# Patient Record
Sex: Female | Born: 1985 | Race: Black or African American | Hispanic: No | Marital: Single | Smoking: Never smoker
Health system: Southern US, Community
[De-identification: ages and names within clinical notes are randomized; demographics above are authoritative.]

---

## 2012-02-22 ENCOUNTER — Other Ambulatory Visit: Payer: Self-pay | Admitting: Obstetrics and Gynecology

## 2012-02-22 ENCOUNTER — Ambulatory Visit
Admission: RE | Admit: 2012-02-22 | Discharge: 2012-02-22 | Disposition: A | Discharge status: Home / Self Care | Payer: 59 | Source: Ambulatory Visit | Attending: Obstetrics and Gynecology | Admitting: Obstetrics and Gynecology

## 2012-02-22 DIAGNOSIS — R109 Unspecified abdominal pain: Secondary | ICD-10-CM

## 2012-12-10 IMAGING — CT CT ABD-PELV W/O CM
2 of 4 series · 17 of 46 positions shown, 19 images · non-contrast
Comparison: None.

CLINICAL DATA: Right flank pain, nausea.  Evaluate for renal
stones.

CT ABDOMEN AND PELVIS WITHOUT CONTRAST
TECHNIQUE: Multidetector CT imaging of the abdomen and pelvis was
performed following the standard protocol without intravenous
contrast.

[Series 2: renal stone w/o · axial · non-contrast · 0.59mm/px · z∈[-393,-38]mm · 14 of 79 slices shown, 16 images]
[im 4/79  soft-tissue]
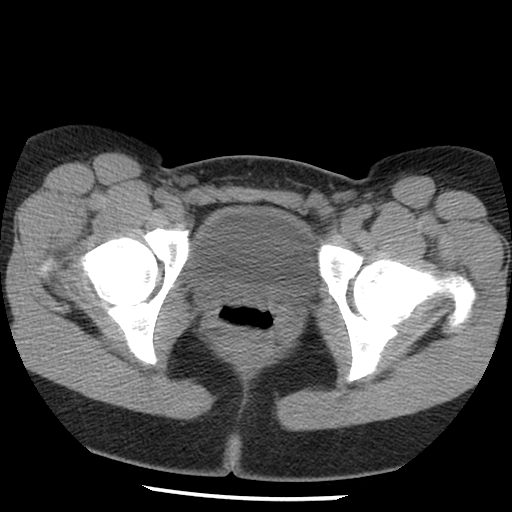
[im 4/79  bone]
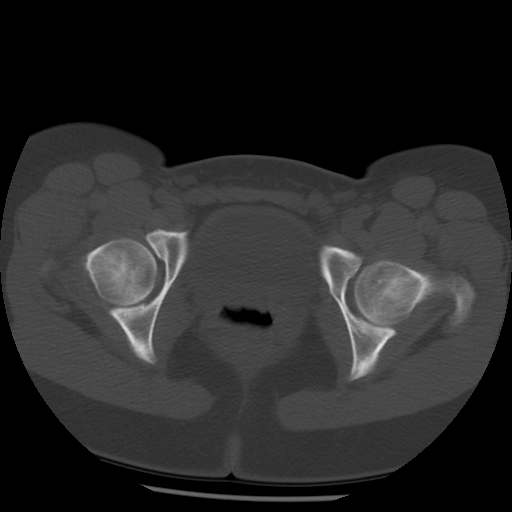
[im 10/79  soft-tissue]
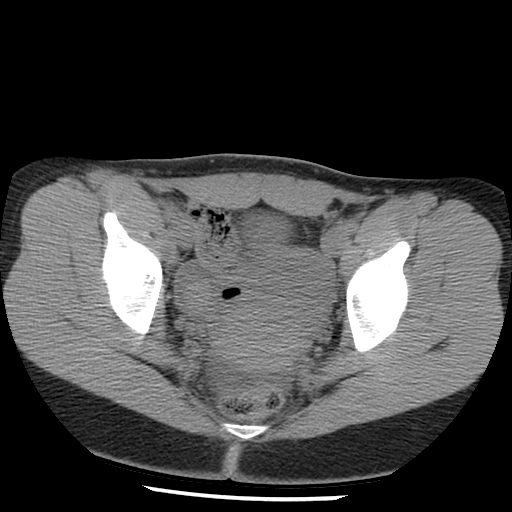
[im 17/79  soft-tissue]
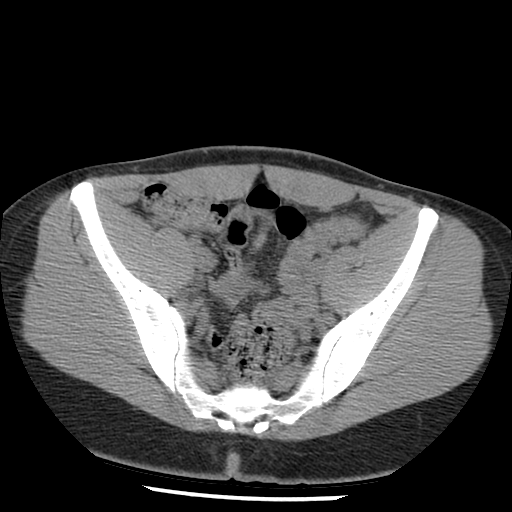
[im 20/79  soft-tissue]
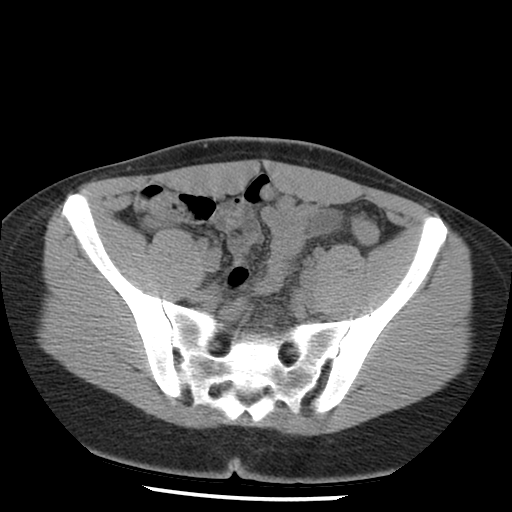
[im 27/79  soft-tissue]
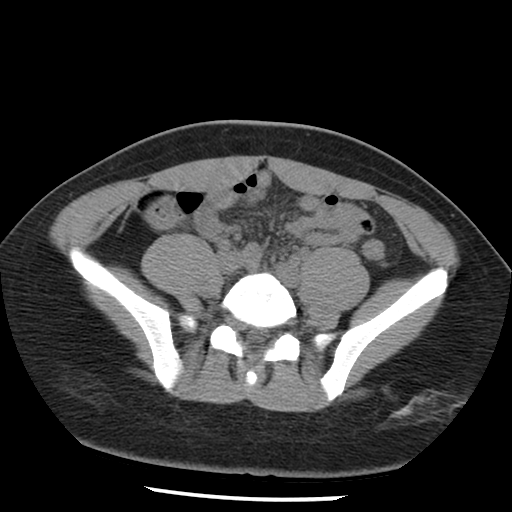
[im 33/79  soft-tissue]
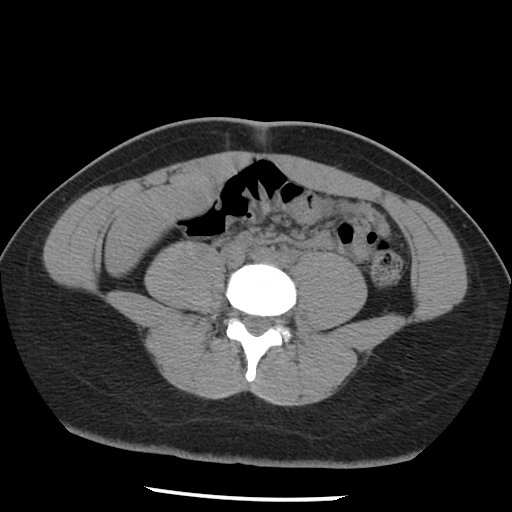
[im 36/79  soft-tissue]
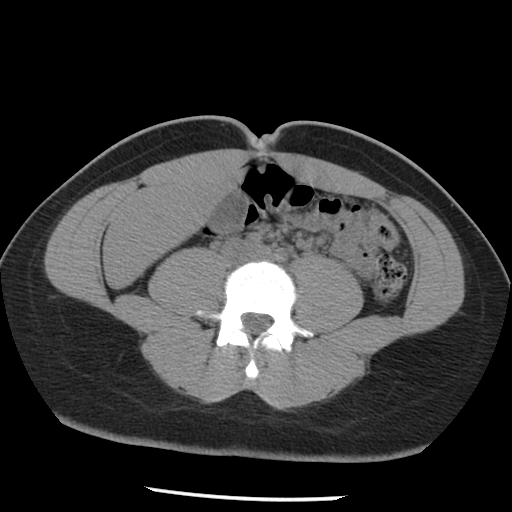
[im 43/79  soft-tissue]
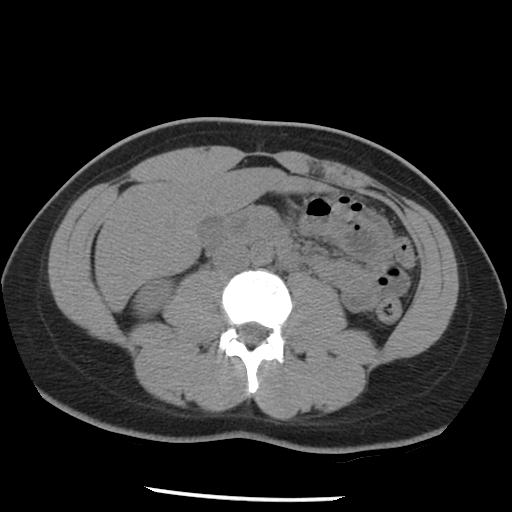
[im 46/79  soft-tissue]
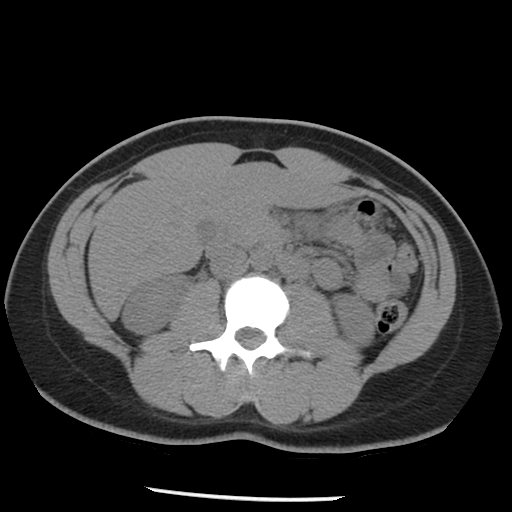
[im 46/79  bone]
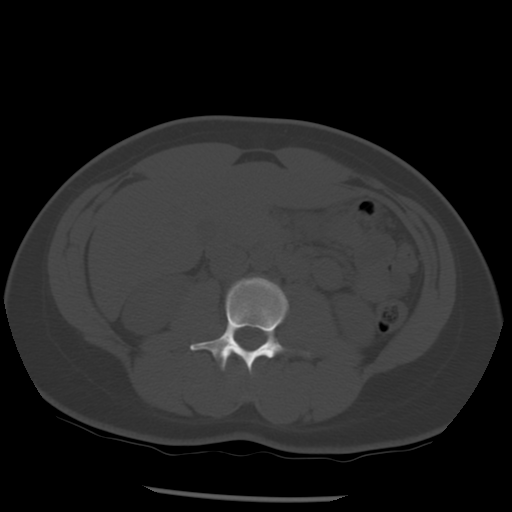
[im 53/79  soft-tissue]
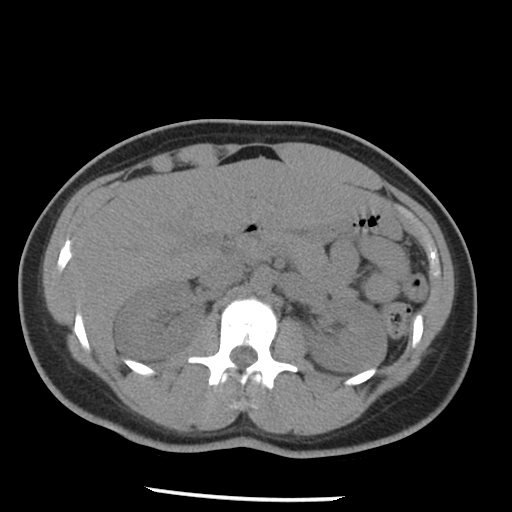
[im 59/79  soft-tissue]
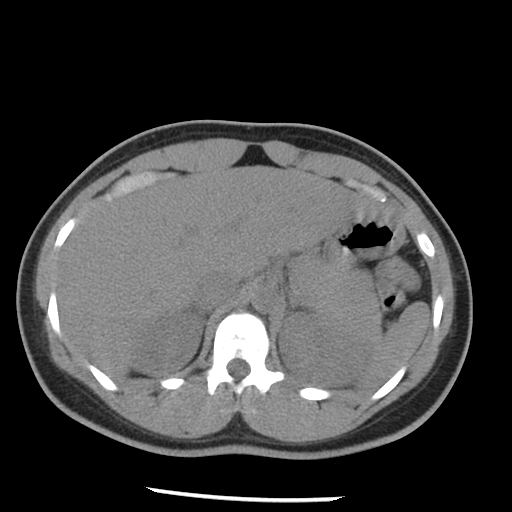
[im 62/79  soft-tissue]
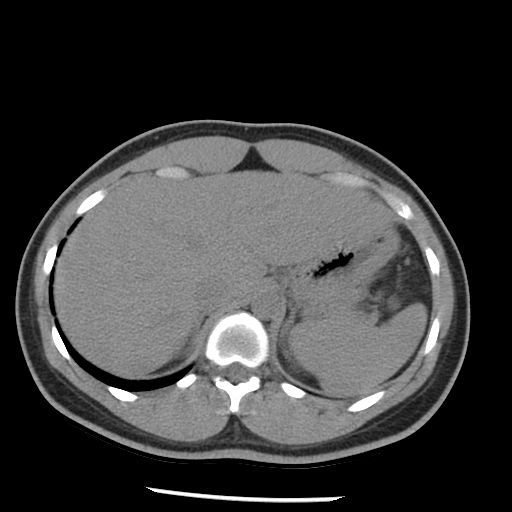
[im 69/79  soft-tissue]
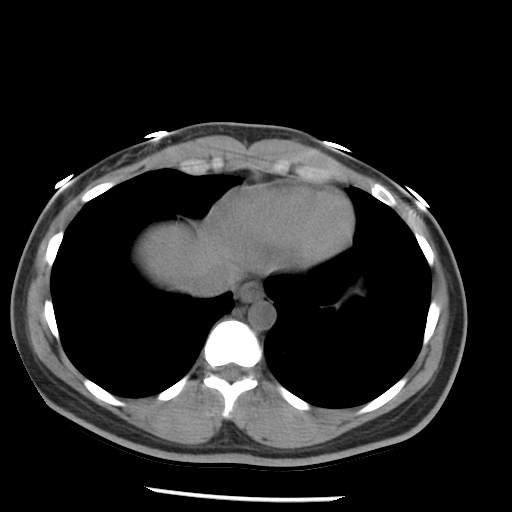
[im 75/79  soft-tissue]
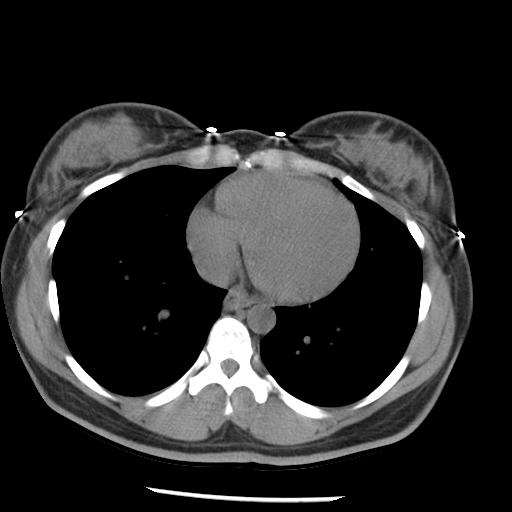

[Series 400: coronal · coronal · 0.94mm/px · 3 of 92 slices shown]
[im 31/92  soft-tissue]
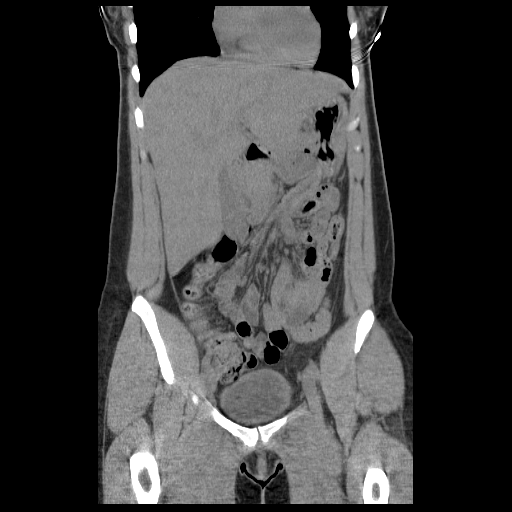
[im 41/92  soft-tissue]
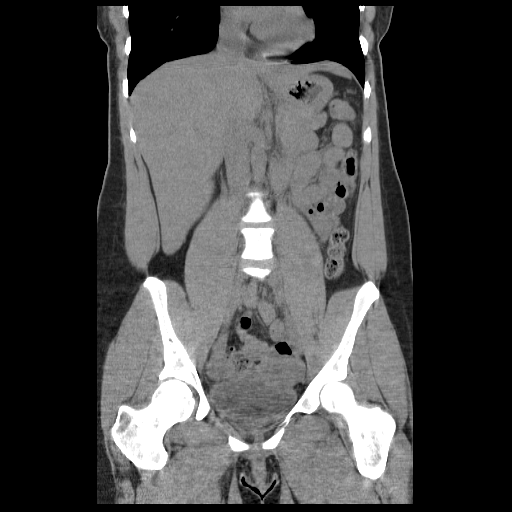
[im 51/92  soft-tissue]
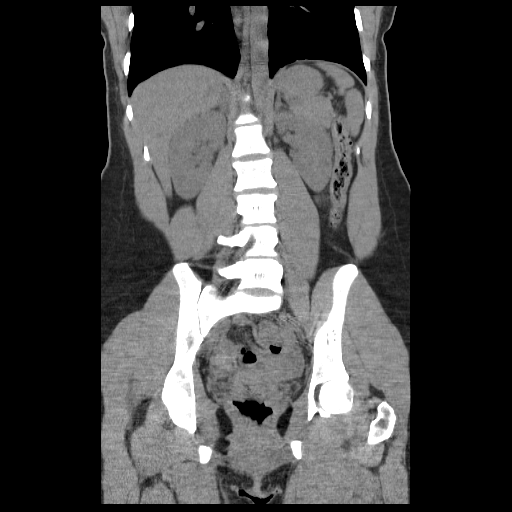

[17 of 46 positions shown; findings below may reference images not displayed]

FINDINGS: Lung bases are clear.  No pericardial fluid.

No focal hepatic lesion.  The gallbladder, pancreas, spleen,
adrenal glands are normal.

No nephrolithiasis or ureterolithiasis.  No obstructive uropathy.

Abdominal aorta normal caliber.  No retroperitoneal periportal
adenopathy.

No free fluid the pelvis.  The bladder is normal.

There is a fullness within the lower uterine segment measuring
cm.  This could represent a lower uterine segment leiomyoma.  The
ovaries appear normal.  There is a coarse round calcification
measure 15 mm adjacent to the right ovary.  This may represent
exophytic leiomyoma which is calcified or potentially a calcified
ovarian lesion.
IMPRESSION: 1..  No nephrolithiasis, ureterolithiasis, obstructive uropathy.
2.  Potential lower uterine segment leiomyoma and right adnexal
indeterminate calcification.  Recommend non emergent outpatient
pelvic ultrasound for further evaluation.

## 2014-10-12 ENCOUNTER — Other Ambulatory Visit: Payer: Self-pay | Admitting: Obstetrics and Gynecology

## 2014-10-13 ENCOUNTER — Encounter (HOSPITAL_COMMUNITY): Payer: Self-pay

## 2014-10-13 ENCOUNTER — Encounter (HOSPITAL_COMMUNITY)
Admission: RE | Admit: 2014-10-13 | Discharge: 2014-10-13 | Disposition: A | Discharge status: Home / Self Care | Payer: PRIVATE HEALTH INSURANCE | Source: Ambulatory Visit | Attending: Obstetrics and Gynecology | Admitting: Obstetrics and Gynecology

## 2014-10-13 DIAGNOSIS — Z01818 Encounter for other preprocedural examination: Secondary | ICD-10-CM | POA: Insufficient documentation

## 2014-10-13 DIAGNOSIS — N839 Noninflammatory disorder of ovary, fallopian tube and broad ligament, unspecified: Secondary | ICD-10-CM | POA: Diagnosis not present

## 2014-10-13 LAB — CBC
HEMATOCRIT: 38.3 % (ref 36.0–46.0)
Hemoglobin: 13.1 g/dL (ref 12.0–15.0)
MCH: 32.3 pg (ref 26.0–34.0)
MCHC: 34.2 g/dL (ref 30.0–36.0)
MCV: 94.6 fL (ref 78.0–100.0)
Platelets: 301 10*3/uL (ref 150–400)
RBC: 4.05 MIL/uL (ref 3.87–5.11)
RDW: 12.9 % (ref 11.5–15.5)
WBC: 7.5 10*3/uL (ref 4.0–10.5)

## 2014-10-13 NOTE — Patient Instructions (Addendum)
   Your procedure is scheduled on: [place] through the Main Entrance of [facility] at: [place] up the phone at the desk and dial [phone] and inform us of your arrival.  Please call this number if you have any problems the morning of surgery: [phone]  Remember: Do not eat food after midnight: JAN 38 Do not drink clear liquids after: JAN 19 Take these medicines the morning of surgery with a SIP OF WATER: NONE  Do not wear jewelry, make-up,  No metal in your hair or on your body. Do not wear lotions, powders, perfumes.  You may wear deodorant.  Do not bring valuables to the hospital. Contacts, dentures or bridgework may not be worn into surgery.  Leave suitcase in the car. After Surgery it may be brought to your room. For patients being admitted to the hospital, checkout time is 11:00am the day of discharge.    Patients discharged on the day of surgery will not be allowed to drive home.

## 2014-10-21 ENCOUNTER — Ambulatory Visit (HOSPITAL_COMMUNITY): Payer: PRIVATE HEALTH INSURANCE | Admitting: Certified Registered Nurse Anesthetist

## 2014-10-21 ENCOUNTER — Encounter (HOSPITAL_COMMUNITY)
Admission: RE | Disposition: A | Discharge status: Home / Self Care | Payer: Self-pay | Source: Ambulatory Visit | Attending: Obstetrics and Gynecology

## 2014-10-21 ENCOUNTER — Encounter (HOSPITAL_COMMUNITY): Payer: Self-pay | Admitting: Certified Registered Nurse Anesthetist

## 2014-10-21 ENCOUNTER — Ambulatory Visit (HOSPITAL_COMMUNITY)
Admission: RE | Admit: 2014-10-21 | Discharge: 2014-10-21 | Disposition: A | Discharge status: Home / Self Care | Payer: PRIVATE HEALTH INSURANCE | Source: Ambulatory Visit | Attending: Obstetrics and Gynecology | Admitting: Obstetrics and Gynecology

## 2014-10-21 DIAGNOSIS — N838 Other noninflammatory disorders of ovary, fallopian tube and broad ligament: Secondary | ICD-10-CM

## 2014-10-21 DIAGNOSIS — D270 Benign neoplasm of right ovary: Secondary | ICD-10-CM | POA: Insufficient documentation

## 2014-10-21 DIAGNOSIS — N8320 Unspecified ovarian cysts: Secondary | ICD-10-CM | POA: Diagnosis present

## 2014-10-21 HISTORY — PX: LAPAROSCOPIC OVARIAN CYSTECTOMY: SHX6248

## 2014-10-21 LAB — PREGNANCY, URINE: Preg Test, Ur: NEGATIVE

## 2014-10-21 SURGERY — EXCISION, CYST, OVARY, LAPAROSCOPIC
Anesthesia: General | Site: Abdomen | Laterality: Right

## 2014-10-21 MED ORDER — FENTANYL CITRATE 0.05 MG/ML IJ SOLN: 25.0000 ug | INTRAMUSCULAR | Status: DC | PRN

## 2014-10-21 MED ORDER — FENTANYL CITRATE 0.05 MG/ML IJ SOLN
INTRAMUSCULAR | Status: AC
Start: 2014-10-21 — End: 2014-10-21
  Filled 2014-10-21: qty 5

## 2014-10-21 MED ORDER — LIDOCAINE HCL (CARDIAC) 20 MG/ML IV SOLN
INTRAVENOUS | Status: DC | PRN
  Administered 2014-10-21: 60 mg via INTRAVENOUS

## 2014-10-21 MED ORDER — DEXAMETHASONE SODIUM PHOSPHATE 10 MG/ML IJ SOLN
INTRAMUSCULAR | Status: DC | PRN
  Administered 2014-10-21: 4 mg via INTRAVENOUS

## 2014-10-21 MED ORDER — HYDROMORPHONE HCL 1 MG/ML IJ SOLN
INTRAMUSCULAR | Status: AC
  Filled 2014-10-21: qty 1

## 2014-10-21 MED ORDER — NEOSTIGMINE METHYLSULFATE 10 MG/10ML IV SOLN
INTRAVENOUS | Status: DC | PRN
  Administered 2014-10-21: 3 mg via INTRAVENOUS

## 2014-10-21 MED ORDER — HYDROMORPHONE HCL 1 MG/ML IJ SOLN
INTRAMUSCULAR | Status: DC | PRN
Start: 2014-10-21 — End: 2014-10-21
  Administered 2014-10-21: 1 mg via INTRAVENOUS

## 2014-10-21 MED ORDER — BUPIVACAINE HCL (PF) 0.25 % IJ SOLN
INTRAMUSCULAR | Status: DC | PRN
  Administered 2014-10-21: 15 mL

## 2014-10-21 MED ORDER — IBUPROFEN 800 MG PO TABS: 800.0000 mg | ORAL_TABLET | Freq: Three times a day (TID) | ORAL | Status: AC | PRN

## 2014-10-21 MED ORDER — GLYCOPYRROLATE 0.2 MG/ML IJ SOLN
INTRAMUSCULAR | Status: DC | PRN
  Administered 2014-10-21: 0.6 mg via INTRAVENOUS

## 2014-10-21 MED ORDER — SCOPOLAMINE 1 MG/3DAYS TD PT72
MEDICATED_PATCH | TRANSDERMAL | Status: AC
  Administered 2014-10-21: 1.5 mg via TRANSDERMAL
  Filled 2014-10-21: qty 1

## 2014-10-21 MED ORDER — KETOROLAC TROMETHAMINE 30 MG/ML IJ SOLN
INTRAMUSCULAR | Status: DC | PRN
  Administered 2014-10-21: 30 mg via INTRAMUSCULAR
  Administered 2014-10-21: 30 mg via INTRAVENOUS

## 2014-10-21 MED ORDER — LACTATED RINGERS IV SOLN
INTRAVENOUS | Status: DC
  Administered 2014-10-21 (×2): via INTRAVENOUS

## 2014-10-21 MED ORDER — ONDANSETRON HCL 4 MG/2ML IJ SOLN
INTRAMUSCULAR | Status: DC | PRN
Start: 2014-10-21 — End: 2014-10-21
  Administered 2014-10-21: 4 mg via INTRAVENOUS

## 2014-10-21 MED ORDER — METOCLOPRAMIDE HCL 5 MG/ML IJ SOLN
10.0000 mg | Freq: Once | INTRAMUSCULAR | Status: AC | PRN
  Administered 2014-10-21: 10 mg via INTRAVENOUS

## 2014-10-21 MED ORDER — MIDAZOLAM HCL 2 MG/2ML IJ SOLN
INTRAMUSCULAR | Status: DC | PRN
  Administered 2014-10-21: 2 mg via INTRAVENOUS

## 2014-10-21 MED ORDER — LIDOCAINE HCL (CARDIAC) 20 MG/ML IV SOLN
INTRAVENOUS | Status: AC
  Filled 2014-10-21: qty 5

## 2014-10-21 MED ORDER — OXYCODONE-ACETAMINOPHEN 5-325 MG PO TABS: 1.0000 | ORAL_TABLET | Freq: Four times a day (QID) | ORAL | Status: AC | PRN

## 2014-10-21 MED ORDER — ROCURONIUM BROMIDE 100 MG/10ML IV SOLN
INTRAVENOUS | Status: AC
  Filled 2014-10-21: qty 1

## 2014-10-21 MED ORDER — ONDANSETRON HCL 4 MG/2ML IJ SOLN
INTRAMUSCULAR | Status: AC
  Filled 2014-10-21: qty 2

## 2014-10-21 MED ORDER — PHENYLEPHRINE 40 MCG/ML (10ML) SYRINGE FOR IV PUSH (FOR BLOOD PRESSURE SUPPORT)
PREFILLED_SYRINGE | INTRAVENOUS | Status: AC
Start: 2014-10-21 — End: 2014-10-21
  Filled 2014-10-21: qty 10

## 2014-10-21 MED ORDER — BUPIVACAINE HCL (PF) 0.25 % IJ SOLN
INTRAMUSCULAR | Status: AC
  Filled 2014-10-21: qty 30

## 2014-10-21 MED ORDER — PROPOFOL 10 MG/ML IV BOLUS
INTRAVENOUS | Status: AC
  Filled 2014-10-21: qty 20

## 2014-10-21 MED ORDER — DEXAMETHASONE SODIUM PHOSPHATE 4 MG/ML IJ SOLN
INTRAMUSCULAR | Status: AC
  Filled 2014-10-21: qty 1

## 2014-10-21 MED ORDER — MEPERIDINE HCL 25 MG/ML IJ SOLN: 6.2500 mg | INTRAMUSCULAR | Status: DC | PRN

## 2014-10-21 MED ORDER — MIDAZOLAM HCL 2 MG/2ML IJ SOLN
INTRAMUSCULAR | Status: AC
  Filled 2014-10-21: qty 2

## 2014-10-21 MED ORDER — PROPOFOL 10 MG/ML IV BOLUS
INTRAVENOUS | Status: DC | PRN
  Administered 2014-10-21: 150 mg via INTRAVENOUS

## 2014-10-21 MED ORDER — FENTANYL CITRATE 0.05 MG/ML IJ SOLN
INTRAMUSCULAR | Status: DC | PRN
  Administered 2014-10-21 (×2): 50 ug via INTRAVENOUS
  Administered 2014-10-21: 100 ug via INTRAVENOUS
  Administered 2014-10-21: 50 ug via INTRAVENOUS

## 2014-10-21 MED ORDER — KETOROLAC TROMETHAMINE 30 MG/ML IJ SOLN
INTRAMUSCULAR | Status: AC
  Filled 2014-10-21: qty 1

## 2014-10-21 MED ORDER — ROCURONIUM BROMIDE 100 MG/10ML IV SOLN
INTRAVENOUS | Status: DC | PRN
  Administered 2014-10-21: 40 mg via INTRAVENOUS

## 2014-10-21 MED ORDER — METOCLOPRAMIDE HCL 5 MG/ML IJ SOLN
INTRAMUSCULAR | Status: AC
  Administered 2014-10-21: 10 mg via INTRAVENOUS
  Filled 2014-10-21: qty 2

## 2014-10-21 MED ORDER — SCOPOLAMINE 1 MG/3DAYS TD PT72
1.0000 | MEDICATED_PATCH | Freq: Once | TRANSDERMAL | Status: DC
  Administered 2014-10-21: 1.5 mg via TRANSDERMAL

## 2014-10-21 MED ORDER — PHENYLEPHRINE HCL 10 MG/ML IJ SOLN
INTRAMUSCULAR | Status: DC | PRN
  Administered 2014-10-21 (×2): 40 ug via INTRAVENOUS

## 2014-10-21 SURGICAL SUPPLY — 33 items
APPLICATOR COTTON TIP 6IN STRL (MISCELLANEOUS) ×2 IMPLANT
BARRIER ADHS 3X4 INTERCEED (GAUZE/BANDAGES/DRESSINGS) ×2 IMPLANT
CABLE HIGH FREQUENCY MONO STRZ (ELECTRODE) ×2 IMPLANT
CATH ROBINSON RED A/P 16FR (CATHETERS) ×2 IMPLANT
CLOTH BEACON ORANGE TIMEOUT ST (SAFETY) ×2 IMPLANT
DRSG COVADERM PLUS 2X2 (GAUZE/BANDAGES/DRESSINGS) ×4 IMPLANT
DRSG OPSITE POSTOP 3X4 (GAUZE/BANDAGES/DRESSINGS) ×2 IMPLANT
ELECT LIGASURE LONG (ELECTRODE) IMPLANT
EVACUATOR SMOKE 8.L (FILTER) ×2 IMPLANT
FORCEPS CUTTING 33CM 5MM (CUTTING FORCEPS) IMPLANT
FORCEPS CUTTING 45CM 5MM (CUTTING FORCEPS) IMPLANT
GLOVE BIOGEL PI IND STRL 7.0 (GLOVE) ×2 IMPLANT
GLOVE BIOGEL PI INDICATOR 7.0 (GLOVE) ×2
GLOVE ECLIPSE 6.5 STRL STRAW (GLOVE) ×2 IMPLANT
GOWN STRL REUS W/TWL LRG LVL3 (GOWN DISPOSABLE) ×6 IMPLANT
LIQUID BAND (GAUZE/BANDAGES/DRESSINGS) ×2 IMPLANT
NEEDLE INSUFFLATION 120MM (ENDOMECHANICALS) ×2 IMPLANT
NS IRRIG 1000ML POUR BTL (IV SOLUTION) ×2 IMPLANT
PACK LAPAROSCOPY BASIN (CUSTOM PROCEDURE TRAY) ×2 IMPLANT
PAD POSITIONER PINK NONSTERILE (MISCELLANEOUS) ×2 IMPLANT
POUCH SPECIMEN RETRIEVAL 10MM (ENDOMECHANICALS) IMPLANT
PROTECTOR NERVE ULNAR (MISCELLANEOUS) ×2 IMPLANT
SCISSORS LAP 5X35 DISP (ENDOMECHANICALS) ×2 IMPLANT
SET IRRIG TUBING LAPAROSCOPIC (IRRIGATION / IRRIGATOR) ×2 IMPLANT
SOLUTION ELECTROLUBE (MISCELLANEOUS) IMPLANT
SUT VICRYL 0 UR6 27IN ABS (SUTURE) ×2 IMPLANT
SUT VICRYL 4-0 PS2 18IN ABS (SUTURE) ×2 IMPLANT
TOWEL OR 17X24 6PK STRL BLUE (TOWEL DISPOSABLE) ×4 IMPLANT
TROCAR BALLN 12MMX100 BLUNT (TROCAR) IMPLANT
TROCAR OPTI TIP 5M 100M (ENDOMECHANICALS) ×4 IMPLANT
TROCAR XCEL DIL TIP R 11M (ENDOMECHANICALS) ×2 IMPLANT
WARMER LAPAROSCOPE (MISCELLANEOUS) ×2 IMPLANT
WATER STERILE IRR 1000ML POUR (IV SOLUTION) ×2 IMPLANT

## 2014-10-21 NOTE — Anesthesia Postprocedure Evaluation (Signed)
  Anesthesia Post-op Note  Patient: Victoria Byrd  Procedure(s) Performed: Procedure(s): operative LAPAROSCOPY ;excision Right OVARIAN mass (Right)  Patient Location: PACU  Anesthesia Type:General  Level of Consciousness: awake, alert  and oriented  Airway and Oxygen Therapy: Patient Spontanous Breathing  Post-op Pain: mild  Post-op Assessment: Post-op Vital signs reviewed, Patient's Cardiovascular Status Stable, Respiratory Function Stable, Patent Airway, No signs of Nausea or vomiting and Pain level controlled  Post-op Vital Signs: Reviewed and stable  Last Vitals:  Filed Vitals:   10/21/14 1230  BP:   Pulse: 82  Temp:   Resp: 11    Complications: No apparent anesthesia complications

## 2014-10-21 NOTE — Anesthesia Preprocedure Evaluation (Signed)
Anesthesia Evaluation  Patient identified by MRN, date of birth, ID band Patient awake    Reviewed: Allergy & Precautions, NPO status , Patient's Chart, lab work & pertinent test results  Airway Mallampati: II  TM Distance: >3 FB Neck ROM: Full    Dental no notable dental hx. (+) Teeth Intact   Pulmonary neg pulmonary ROS,  breath sounds clear to auscultation  Pulmonary exam normal       Cardiovascular negative cardio ROS  Rhythm:Regular Rate:Normal     Neuro/Psych negative neurological ROS  negative psych ROS   GI/Hepatic negative GI ROS, Neg liver ROS,   Endo/Other  negative endocrine ROS  Renal/GU negative Renal ROS  negative genitourinary   Musculoskeletal negative musculoskeletal ROS (+)   Abdominal (+) - obese,   Peds  Hematology negative hematology ROS (+)   Anesthesia Other Findings   Reproductive/Obstetrics Right Ovarian Dermoid                             Anesthesia Physical Anesthesia Plan  ASA: I  Anesthesia Plan: General   Post-op Pain Management:    Induction: Intravenous  Airway Management Planned: Oral ETT  Additional Equipment:   Intra-op Plan:   Post-operative Plan: Extubation in OR  Informed Consent: I have reviewed the patients History and Physical, chart, labs and discussed the procedure including the risks, benefits and alternatives for the proposed anesthesia with the patient or authorized representative who has indicated his/her understanding and acceptance.   Dental advisory given  Plan Discussed with: Anesthesiologist, CRNA and Surgeon  Anesthesia Plan Comments:         Anesthesia Quick Evaluation

## 2014-10-21 NOTE — Brief Op Note (Signed)
10/21/2014  12:05 PM  PATIENT:  Victoria Byrd  29 y.o. female  PRE-OPERATIVE DIAGNOSIS:  Right Ovarian Dermoid  POST-OPERATIVE DIAGNOSIS:  right ovarian mass ? fibroma  PROCEDURE:  Procedure(s): operative LAPAROSCOPY ;excision Right OVARIAN mass (Right)  SURGEON:  Surgeon(s) and Role:    * Estie Sproule A Abdullahi Vallone, MD - Primary  PHYSICIAN ASSISTANT:   ASSISTANTS: Julianne Handler, CNM   ANESTHESIA:   general Findings: nl appendix, nl left ovary, nl uterus, nl anterior and posterior cul desac, right distal 2cm hard ovarian mass, nl tubes, nl liver edge. No dermoid seen EBL:  Total I/O In: 1100 [I.V.:1100] Out: 190 [Urine:175; Blood:15]  BLOOD ADMINISTERED:none  DRAINS: none   LOCAL MEDICATIONS USED:  NONE  SPECIMEN:  Source of Specimen:  right ovarian mass  DISPOSITION OF SPECIMEN:  PATHOLOGY  COUNTS:  YES  TOURNIQUET:  * No tourniquets in log *  DICTATION: .Other Dictation: Dictation Number [id number]  PLAN OF CARE: Discharge to home after PACU  PATIENT DISPOSITION:  PACU - hemodynamically stable.   Delay start of Pharmacological VTE agent (>24hrs) due to surgical blood loss or risk of bleeding: no

## 2014-10-21 NOTE — Transfer of Care (Signed)
Immediate Anesthesia Transfer of Care Note  Patient: Victoria Byrd  Procedure(s) Performed: Procedure(s): operative LAPAROSCOPY ;excision Right OVARIAN mass (Right)  Patient Location: PACU  Anesthesia Type:General  Level of Consciousness: awake, alert  and oriented  Airway & Oxygen Therapy: Patient Spontanous Breathing and Patient connected to nasal cannula oxygen  Post-op Assessment: Report given to PACU RN, Post -op Vital signs reviewed and stable and Patient moving all extremities  Post vital signs: Reviewed and stable  Complications: No apparent anesthesia complications

## 2014-10-21 NOTE — Anesthesia Procedure Notes (Signed)
Procedure Name: Intubation Date/Time: 10/21/2014 9:43 AM Performed by: Raenette Rover Pre-anesthesia Checklist: Patient identified, Emergency Drugs available, Suction available and Patient being monitored Patient Re-evaluated:Patient Re-evaluated prior to inductionOxygen Delivery Method: Circle system utilized Preoxygenation: Pre-oxygenation with 100% oxygen Intubation Type: IV induction Ventilation: Mask ventilation without difficulty Laryngoscope Size: Miller and 2 Grade View: Grade I Tube type: Oral Tube size: 7.0 mm Number of attempts: 1 Placement Confirmation: ETT inserted through vocal cords under direct vision,  positive ETCO2,  CO2 detector and breath sounds checked- equal and bilateral Secured at: 20 cm Tube secured with: Tape Dental Injury: Teeth and Oropharynx as per pre-operative assessment

## 2014-10-21 NOTE — Discharge Instructions (Signed)

## 2014-10-21 NOTE — H&P (Signed)
Victoria Byrd is an 29 y.o. female. 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   Pertinent Gynecological History: Menses: reg q month Bleeding: nl Contraception: OCP (estrogen/progesterone) DES exposure: denies Blood transfusions: none Sexually transmitted diseases: no past history Previous GYN Procedures: DNC  Last mammogram: n/a Date: n/a Last pap: normal Date: 03/18/2013 OB History: G1, P0010   Menstrual History: Menarche age: n/a  Social History:  reports that she has never smoked. She has never used smokeless tobacco. She reports that she does not drink alcohol or use illicit drugs.  Allergies: No Known Allergies  Meds: loestrin 1/20 Medical: none Surgery: D&E OB: TAB FH: noncontributory Review of Systems  All other systems reviewed and are negative.   There were no vitals taken for this visit. Physical Exam  Constitutional: She is oriented to person, place, and time. She appears well-developed and well-nourished.  HENT:  Head: Atraumatic.  Eyes: EOM are normal.  Neck: Neck supple.  Cardiovascular: Regular rhythm.   Respiratory: Breath sounds normal.  GI: Soft.  Genitourinary: Vagina normal and uterus normal.  Musculoskeletal: Normal range of motion.  Neurological: She is alert and oriented to person, place, and time.  Skin: Skin is warm and dry.  Psychiatric: She has a normal mood and affect.    No results found for this or any previous visit (from the past 24 hour(s)).  No results found.  Assessment/Plan: Right ovarian dermoid P0 dx laparoscopy, right ovarian cystectomy. Procedure explained. Risk of surgery reviewed including, infection, bleeding, injury to underlying organs, poss loss of right ovary  Victoria Byrd A 10/21/2014, 2:28 AM

## 2014-10-22 ENCOUNTER — Encounter (HOSPITAL_COMMUNITY): Payer: Self-pay | Admitting: Obstetrics and Gynecology

## 2014-10-22 NOTE — Op Note (Signed)
NAME:  Victoria Byrd, CASEBEER NO.:  [id number]  MEDICAL RECORD NO.:  [id number]  LOCATION:  [facility]                          FACILITY:  [facility]  PHYSICIAN:  Servando Salina, M.D.DATE OF BIRTH:  1986/02/21  DATE OF PROCEDURE:  10/21/2014 DATE OF DISCHARGE:  10/21/2014                              OPERATIVE REPORT   PREOPERATIVE DIAGNOSIS:  Right ovarian dermoid cyst.  PROCEDURES:  Operative laparoscopy, excision of right ovarian mass.  POSTOPERATIVE DIAGNOSIS:  Right ovarian mass, question ovarian fibroma.  ANESTHESIA:  General.  SURGEON:  Servando Salina, M.D.  ASSISTANT:  Julianne Handler, CNM  DESCRIPTION OF PROCEDURE:  Under adequate general anesthesia, the patient was placed in a dorsal lithotomy position.  She was sterilely prepped and draped in the usual fashion.  Indwelling Foley catheter was sterilely placed.  Examination under anesthesia revealed anteverted uterus.  Right adnexal mass could be palpable.  No masses on the left side were noted.  A bivalve speculum was placed in the vagina.  Single- tooth tenaculum was placed on the anterior lip of the cervix and Acorn cannula was introduced into the cervical os and attached to tenaculum for manipulation of the uterus.  The bivalve speculum was then removed. Attention was then turned to the abdomen.  A 0.25% Marcaine was injected infraumbilically.  Infraumbilical incision was then made.  Veress needle was introduced and tested.  A 3 L of CO2 was insufflated.  Veress needle was then removed.  A disposable 10 mm trocar with sleeve was introduced into the abdomen without incident.  A lighted video laparoscope was inserted, entry into the abdomen was without incident.  Panoramic inspection was then done.  Normal liver edge was noted.  Small adhesion to the right anterior abdominal wall was noted.  The patient was placed in Trendelenburg position.  The uterus was noted.  The left tube and ovary were noted to be  normal.  The right ovary was not initially seen and 0.25% Marcaine was injected in the left lower quadrant and a small incision was then made.  A 5-mm port was placed under direct visualization.  Then using a probe, the pelvis was inspected.  Anterior and posterior cul-de-sac were without any endometriotic lesions.  The right ovary was noted to have a mass arising off its distal portion, normal tube, normal appendix, normal left ovary and tube.  The right ovary did not have the appearance of the 3.6 cm anticipated dermoid to be seen.  Nonetheless, a right lower quadrant incision was then made and a 5-mm port was then placed as well.  On further inspection, that mass was solid and using hot scissors as well as a hook, that mass was partially separated from the ovary proper.  In looking for the presumed dermoid, a vertical incision was made closer to the area that appeared fluctuant, however, no significant amount of fluid was removed.  There was an area that went a bit deeper when the incision was extended that appeared to be potentially cystic as well and with a linear incision placed over that side, again it was solid area and therefore no evidence of dermoid was noted.  At that point, decision was  then made to remove the mass off the right ovary and a hook was then inserted with removal of the ovarian mass.  Single-tooth tenaculum was used to hold the mass. The 5 mm scope was then placed in one of the lower ports and the Endobag was placed through the umbilical site with that mass being placed in the bag and brought up into the field.  The port site of infraumbilical was then removed and the fascial incision had to be slightly extended in order to facilitate removal of the Endobag and its content.  Once this was done, the fascia was identified.  Pursestring suture of O-Vicryl was then placed  on the fascia and the 10 mm port was then reinserted.  The 5 mm scope was then utilized to inspect  the pelvis.  Pelvis was copiously irrigated and suctioned of debris.  Small bleeding along the area of the base of the excised mass was cauterized.  Interceed was then placed overlying the right ovary.  The lower ports were then removed.  The abdomen was then carefully deflated and the port removed and the pursestring fascial suture closed. Thew skin incisions were then closed with 4-0 Vicryl subcuticular sutures.  The instruments in the vagina were removed as was the Foley catheter.  SPECIMEN:  Ovarian mass, sent to Pathology.  ESTIMATED BLOOD LOSS:  Less than 10 mL.  COMPLICATION:  None.  The patient tolerated the procedure well, was transferred to recovery in stable condition.     Servando Salina, M.D.     [place]/MEDQ  D:  10/21/2014  T:  10/22/2014  Job:  [id number]

## 2018-06-12 DIAGNOSIS — Z118 Encounter for screening for other infectious and parasitic diseases: Secondary | ICD-10-CM | POA: Diagnosis not present

## 2018-06-12 DIAGNOSIS — Z1159 Encounter for screening for other viral diseases: Secondary | ICD-10-CM | POA: Diagnosis not present

## 2018-06-12 DIAGNOSIS — Z6827 Body mass index (BMI) 27.0-27.9, adult: Secondary | ICD-10-CM | POA: Diagnosis not present

## 2018-06-12 DIAGNOSIS — Z114 Encounter for screening for human immunodeficiency virus [HIV]: Secondary | ICD-10-CM | POA: Diagnosis not present

## 2018-06-12 DIAGNOSIS — R39198 Other difficulties with micturition: Secondary | ICD-10-CM | POA: Diagnosis not present

## 2018-06-12 DIAGNOSIS — Z01419 Encounter for gynecological examination (general) (routine) without abnormal findings: Secondary | ICD-10-CM | POA: Diagnosis not present

## 2018-06-12 DIAGNOSIS — R300 Dysuria: Secondary | ICD-10-CM | POA: Diagnosis not present

## 2018-06-12 DIAGNOSIS — Z113 Encounter for screening for infections with a predominantly sexual mode of transmission: Secondary | ICD-10-CM | POA: Diagnosis not present

## 2018-06-12 DIAGNOSIS — Z1151 Encounter for screening for human papillomavirus (HPV): Secondary | ICD-10-CM | POA: Diagnosis not present

## 2018-07-15 DIAGNOSIS — N390 Urinary tract infection, site not specified: Secondary | ICD-10-CM | POA: Diagnosis not present

## 2018-07-15 DIAGNOSIS — B9620 Unspecified Escherichia coli [E. coli] as the cause of diseases classified elsewhere: Secondary | ICD-10-CM | POA: Diagnosis not present

## 2018-07-15 DIAGNOSIS — N870 Mild cervical dysplasia: Secondary | ICD-10-CM | POA: Diagnosis not present

## 2018-07-15 DIAGNOSIS — R87610 Atypical squamous cells of undetermined significance on cytologic smear of cervix (ASC-US): Secondary | ICD-10-CM | POA: Diagnosis not present

## 2018-10-29 DIAGNOSIS — H5213 Myopia, bilateral: Secondary | ICD-10-CM | POA: Diagnosis not present

## 2019-09-19 DIAGNOSIS — Z124 Encounter for screening for malignant neoplasm of cervix: Secondary | ICD-10-CM | POA: Diagnosis not present

## 2019-09-19 DIAGNOSIS — Z1151 Encounter for screening for human papillomavirus (HPV): Secondary | ICD-10-CM | POA: Diagnosis not present

## 2019-09-19 DIAGNOSIS — Z6830 Body mass index (BMI) 30.0-30.9, adult: Secondary | ICD-10-CM | POA: Diagnosis not present

## 2019-09-19 DIAGNOSIS — R39198 Other difficulties with micturition: Secondary | ICD-10-CM | POA: Diagnosis not present

## 2019-09-19 DIAGNOSIS — Z01419 Encounter for gynecological examination (general) (routine) without abnormal findings: Secondary | ICD-10-CM | POA: Diagnosis not present
